# Patient Record
Sex: Female | Born: 1965 | Hispanic: Yes | Marital: Married
Health system: Southern US, Community
[De-identification: ages and names within clinical notes are randomized; demographics above are authoritative.]

---

## 2012-01-26 ENCOUNTER — Ambulatory Visit: Payer: Self-pay | Admitting: Family Medicine

## 2013-03-14 ENCOUNTER — Ambulatory Visit: Payer: Self-pay | Admitting: Family Medicine

## 2013-03-15 ENCOUNTER — Ambulatory Visit: Payer: Self-pay | Admitting: Family Medicine

## 2013-06-28 ENCOUNTER — Encounter: Payer: Self-pay | Admitting: Surgery

## 2013-07-02 ENCOUNTER — Ambulatory Visit: Payer: Self-pay | Admitting: Surgery

## 2013-07-02 LAB — WOUND CULTURE

## 2013-07-09 ENCOUNTER — Ambulatory Visit: Payer: Self-pay | Admitting: Surgery

## 2013-07-09 ENCOUNTER — Encounter: Payer: Self-pay | Admitting: Surgery

## 2013-08-06 ENCOUNTER — Ambulatory Visit: Payer: Self-pay | Admitting: Surgery

## 2015-04-24 ENCOUNTER — Other Ambulatory Visit: Payer: Self-pay | Admitting: Family Medicine

## 2015-04-24 DIAGNOSIS — Z1231 Encounter for screening mammogram for malignant neoplasm of breast: Secondary | ICD-10-CM

## 2015-05-06 ENCOUNTER — Ambulatory Visit
Admission: RE | Admit: 2015-05-06 | Discharge: 2015-05-06 | Disposition: A | Discharge status: Home / Self Care | Payer: No Typology Code available for payment source | Source: Ambulatory Visit | Attending: Family Medicine | Admitting: Family Medicine

## 2015-05-06 DIAGNOSIS — Z1231 Encounter for screening mammogram for malignant neoplasm of breast: Secondary | ICD-10-CM | POA: Diagnosis present

## 2016-05-28 ENCOUNTER — Other Ambulatory Visit: Payer: Self-pay | Admitting: Primary Care

## 2016-05-28 DIAGNOSIS — Z1231 Encounter for screening mammogram for malignant neoplasm of breast: Secondary | ICD-10-CM

## 2016-06-30 ENCOUNTER — Ambulatory Visit
Admission: RE | Admit: 2016-06-30 | Discharge: 2016-06-30 | Disposition: A | Discharge status: Home / Self Care | Payer: BLUE CROSS/BLUE SHIELD | Source: Ambulatory Visit | Attending: Primary Care | Admitting: Primary Care

## 2016-06-30 DIAGNOSIS — Z1231 Encounter for screening mammogram for malignant neoplasm of breast: Secondary | ICD-10-CM | POA: Diagnosis not present

## 2017-05-23 ENCOUNTER — Other Ambulatory Visit: Payer: Self-pay | Admitting: Primary Care

## 2017-05-23 DIAGNOSIS — Z1231 Encounter for screening mammogram for malignant neoplasm of breast: Secondary | ICD-10-CM

## 2017-07-05 ENCOUNTER — Ambulatory Visit
Admission: RE | Admit: 2017-07-05 | Discharge: 2017-07-05 | Disposition: A | Discharge status: Home / Self Care | Payer: BLUE CROSS/BLUE SHIELD | Source: Ambulatory Visit | Attending: Primary Care | Admitting: Primary Care

## 2017-07-05 DIAGNOSIS — Z1231 Encounter for screening mammogram for malignant neoplasm of breast: Secondary | ICD-10-CM | POA: Diagnosis present

## 2019-11-02 ENCOUNTER — Ambulatory Visit: Payer: No Typology Code available for payment source | Attending: Internal Medicine

## 2019-11-02 DIAGNOSIS — Z23 Encounter for immunization: Secondary | ICD-10-CM

## 2019-11-02 NOTE — Progress Notes (Signed)
   Covid-19 Vaccination Clinic  Name:  Stacy Edwards    MRN: [id number] DOB: 12-Jul-1966  11/02/2019  [name] was observed post Covid-19 immunization for 15 minutes without incident. She was provided with Vaccine Information Sheet and instruction to access the V-Safe system.   [name] was instructed to call 911 with any severe reactions post vaccine: Marland Kitchen Difficulty breathing  . Swelling of face and throat  . A fast heartbeat  . A bad rash all over body  . Dizziness and weakness   Immunizations Administered    Name Date Dose VIS Date Route   Pfizer COVID-19 Vaccine 11/02/2019  3:28 PM 0.3 mL 07/20/2019 Intramuscular   Manufacturer: [employer], [employer]   Lot: [id number]   NDC: [address]-3

## 2019-11-23 ENCOUNTER — Ambulatory Visit: Payer: No Typology Code available for payment source | Attending: Internal Medicine

## 2019-11-23 DIAGNOSIS — Z23 Encounter for immunization: Secondary | ICD-10-CM

## 2019-11-23 NOTE — Progress Notes (Signed)
   Covid-19 Vaccination Clinic  Name:  Stacy Edwards    MRN: [id number] DOB: [birth year]  11/23/2019  Ms. [name] was observed post Covid-19 immunization for 15 minutes without incident. She was provided with Vaccine Information Sheet and instruction to access the V-Safe system.   Ms. [name] was instructed to call 911 with any severe reactions post vaccine: Marland Kitchen Difficulty breathing  . Swelling of face and throat  . A fast heartbeat  . A bad rash all over body  . Dizziness and weakness   Immunizations Administered    Name Date Dose VIS Date Route   Pfizer COVID-19 Vaccine 11/23/2019  3:43 PM 0.3 mL 07/20/2019 Intramuscular   Manufacturer: [employer], [employer]   Lot: [id number]   NDC: [address]-3

## 2020-02-01 ENCOUNTER — Other Ambulatory Visit: Payer: Self-pay | Admitting: Primary Care

## 2020-02-01 DIAGNOSIS — Z1231 Encounter for screening mammogram for malignant neoplasm of breast: Secondary | ICD-10-CM

## 2020-02-29 ENCOUNTER — Ambulatory Visit
Admission: RE | Admit: 2020-02-29 | Discharge: 2020-02-29 | Disposition: A | Discharge status: Home / Self Care | Payer: BLUE CROSS/BLUE SHIELD | Source: Ambulatory Visit | Attending: Primary Care | Admitting: Primary Care

## 2020-02-29 DIAGNOSIS — Z1231 Encounter for screening mammogram for malignant neoplasm of breast: Secondary | ICD-10-CM | POA: Diagnosis present

## 2021-04-16 IMAGING — MG DIGITAL SCREENING BILAT W/ TOMO W/ CAD
8 series · 9 of 24 positions shown · non-contrast
Comparison: Previous exam(s).

CLINICAL DATA: Screening.

EXAM:
DIGITAL SCREENING BILATERAL MAMMOGRAM WITH TOMO AND CAD

[R MLO synth-2D]
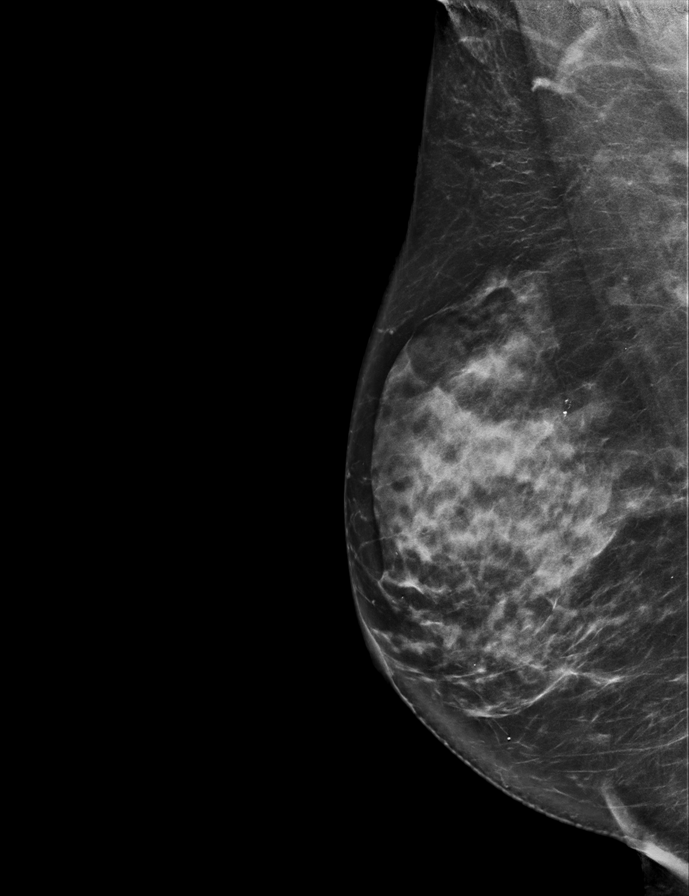

[R CC synth-2D]
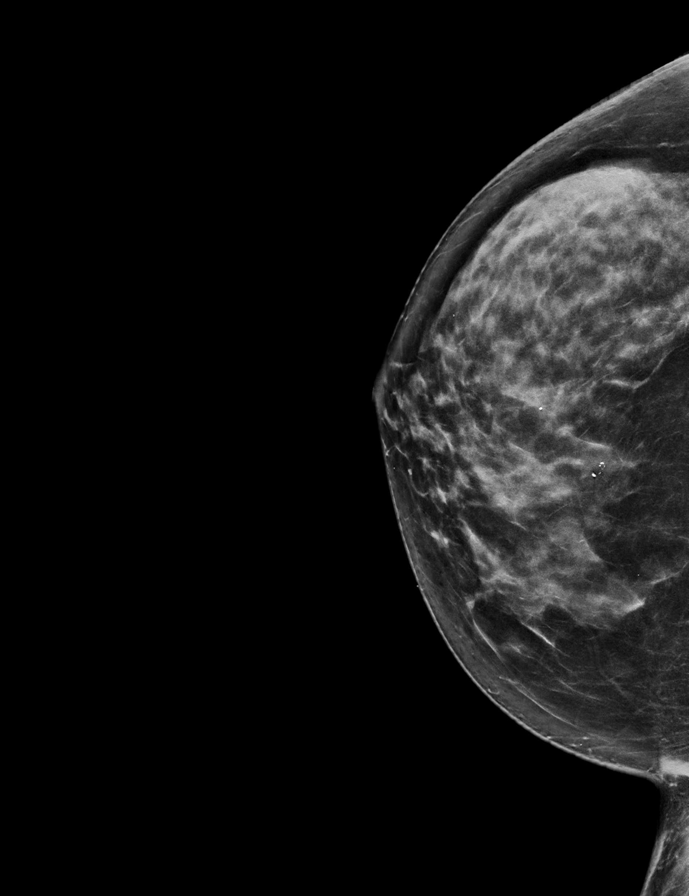

[L MLO synth-2D]
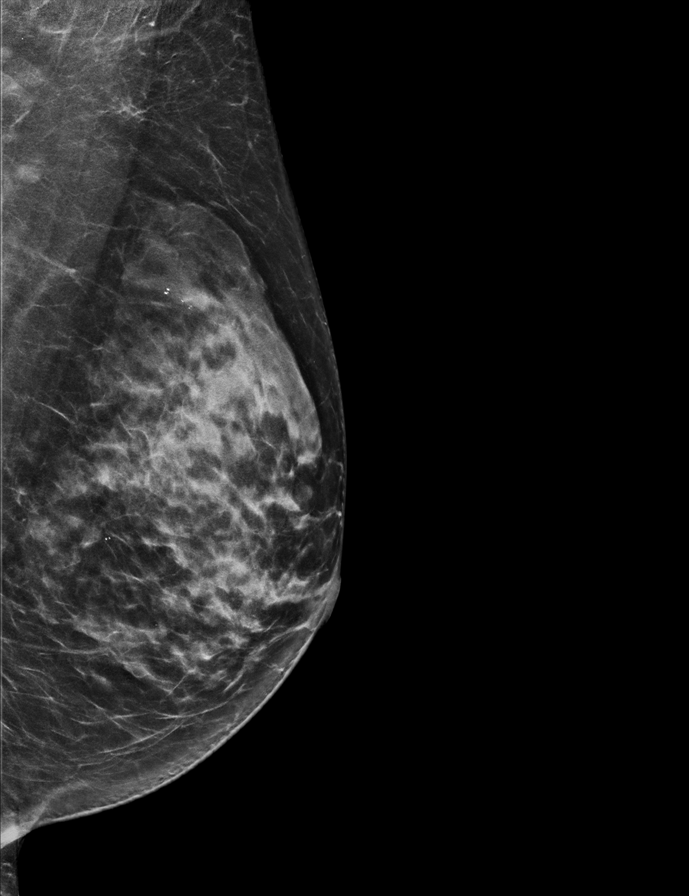

[L CC synth-2D]
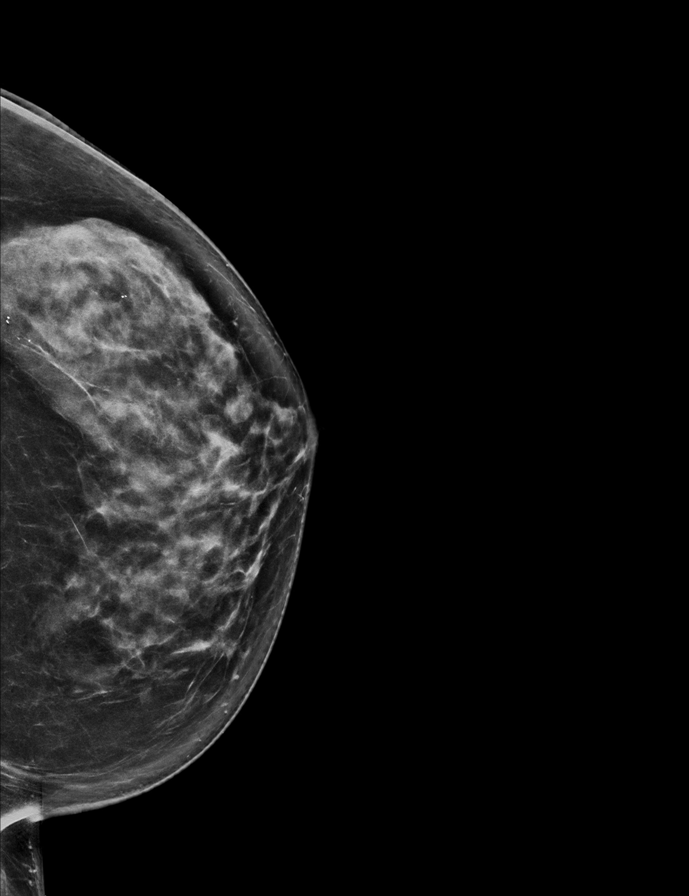

[L MLO tomo · 2 of 66 frames shown]
[frame 22/66]
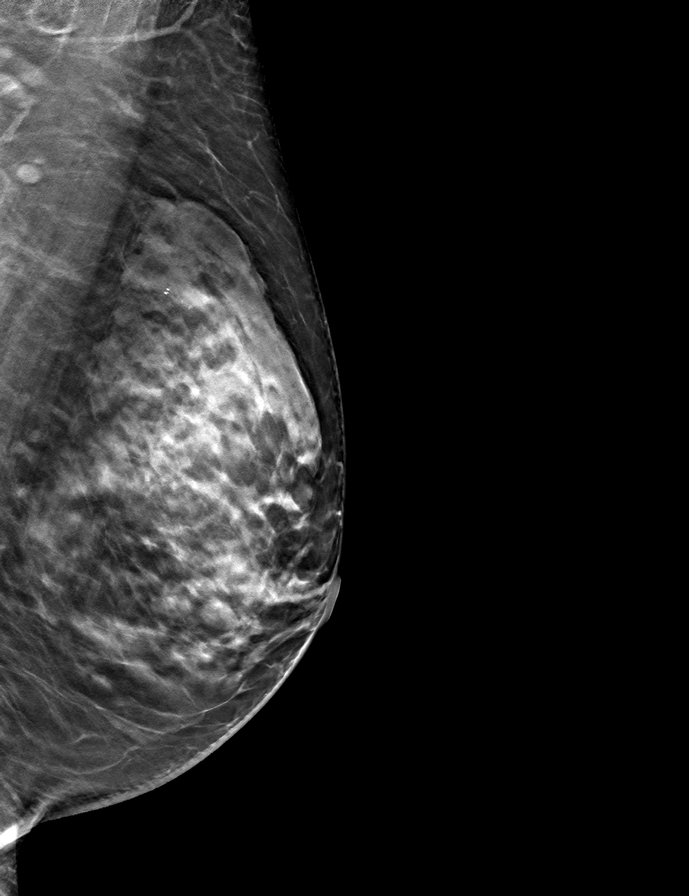
[frame 33/66]
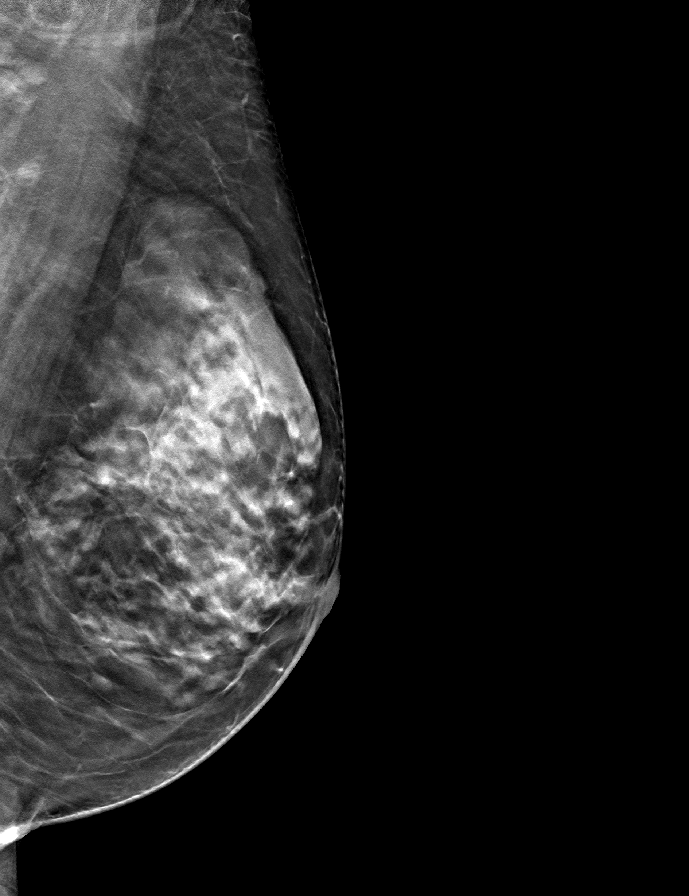

[L CC tomo · tomo slice 36/71.0]
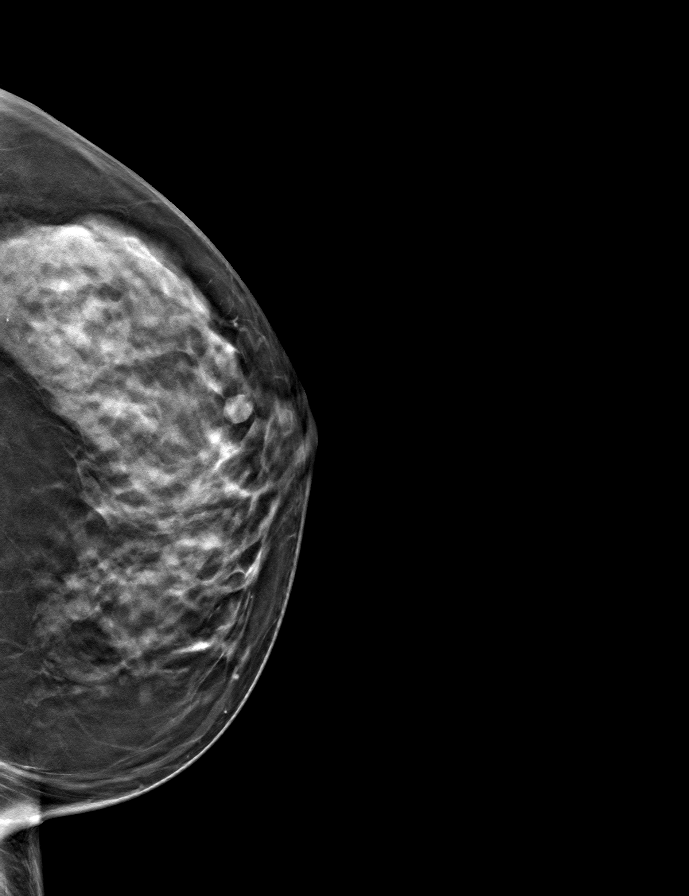

[R MLO tomo · tomo slice 39/77.0]
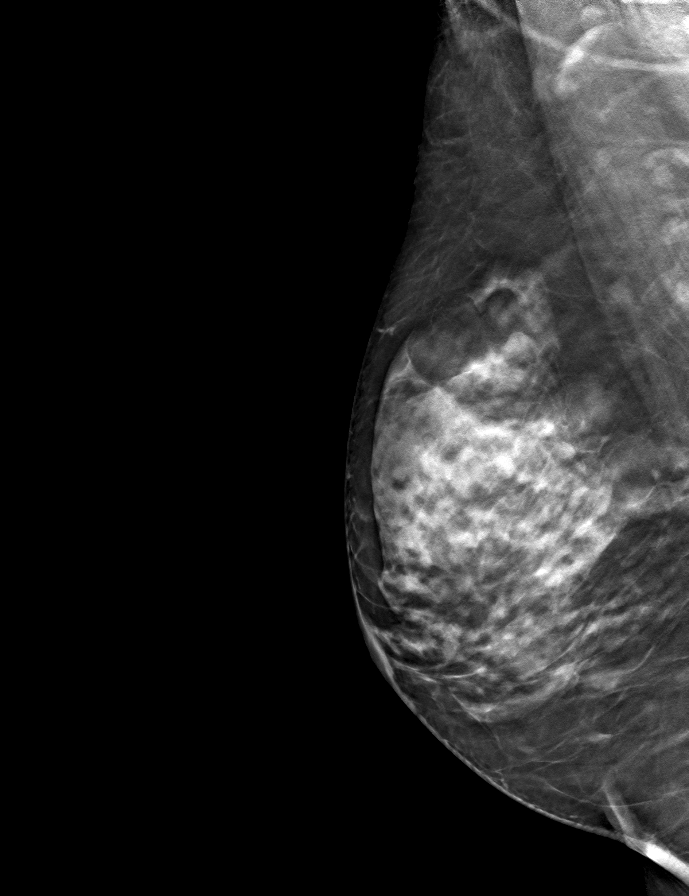

[R CC tomo · tomo slice 36/71.0]
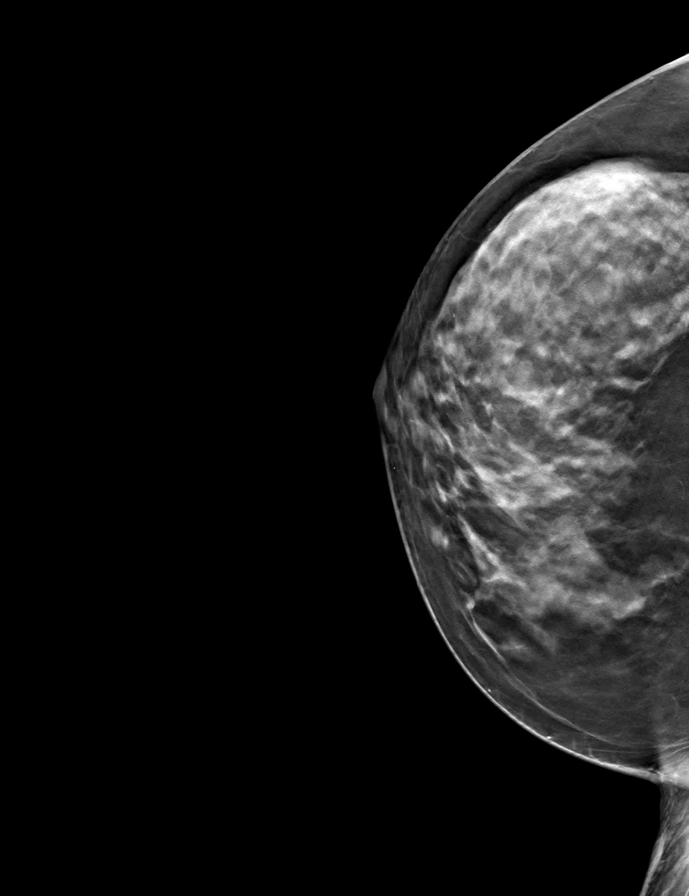

[9 of 24 positions shown; findings below may reference images not displayed]

ACR Breast Density Category c: The breast tissue is heterogeneously
dense, which may obscure small masses.
FINDINGS: There are no findings suspicious for malignancy. Images were
processed with CAD.
IMPRESSION: No mammographic evidence of malignancy. A result letter of this
screening mammogram will be mailed directly to the patient.

RECOMMENDATION:
Screening mammogram in one year. (Code:[id number])

BI-RADS CATEGORY  1: Negative.

## 2021-07-15 ENCOUNTER — Other Ambulatory Visit: Payer: Self-pay | Admitting: Primary Care

## 2021-07-15 DIAGNOSIS — Z1231 Encounter for screening mammogram for malignant neoplasm of breast: Secondary | ICD-10-CM

## 2022-11-17 ENCOUNTER — Other Ambulatory Visit: Payer: Self-pay | Admitting: Primary Care

## 2022-11-17 DIAGNOSIS — Z1231 Encounter for screening mammogram for malignant neoplasm of breast: Secondary | ICD-10-CM

## 2022-12-15 ENCOUNTER — Other Ambulatory Visit: Payer: Self-pay | Admitting: Primary Care

## 2022-12-15 DIAGNOSIS — R928 Other abnormal and inconclusive findings on diagnostic imaging of breast: Secondary | ICD-10-CM

## 2022-12-16 ENCOUNTER — Inpatient Hospital Stay
Admission: RE | Admit: 2022-12-16 | Discharge: 2022-12-16 | Disposition: A | Discharge status: Home / Self Care | Payer: Self-pay | Source: Ambulatory Visit | Attending: Primary Care | Admitting: Primary Care

## 2022-12-16 ENCOUNTER — Other Ambulatory Visit: Payer: Self-pay | Admitting: *Deleted

## 2022-12-16 DIAGNOSIS — Z1231 Encounter for screening mammogram for malignant neoplasm of breast: Secondary | ICD-10-CM
